# Patient Record
Sex: Female | Born: 1995 | Race: Black or African American | Hispanic: No | Marital: Single | Smoking: Former smoker
Health system: Southern US, Community
[De-identification: ages and names within clinical notes are randomized; demographics above are authoritative.]

## PROBLEM LIST (undated history)

## (undated) DIAGNOSIS — Z789 Other specified health status: Secondary | ICD-10-CM

## (undated) HISTORY — PX: NO PAST SURGERIES: SHX2092

## (undated) HISTORY — DX: Other specified health status: Z78.9

---
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
## 2016-07-19 ENCOUNTER — Ambulatory Visit (INDEPENDENT_AMBULATORY_CARE_PROVIDER_SITE_OTHER): Payer: BLUE CROSS/BLUE SHIELD | Admitting: *Deleted

## 2016-07-19 ENCOUNTER — Encounter: Payer: Self-pay | Admitting: Obstetrics and Gynecology

## 2016-07-19 DIAGNOSIS — Z3201 Encounter for pregnancy test, result positive: Secondary | ICD-10-CM

## 2016-07-19 DIAGNOSIS — Z3200 Encounter for pregnancy test, result unknown: Secondary | ICD-10-CM

## 2016-07-19 LAB — POCT PREGNANCY, URINE: Preg Test, Ur: POSITIVE — AB

## 2016-07-19 NOTE — Progress Notes (Signed)
UPT + today.  LMP = 06/07/16.  EDD - 03/14/17, 6w 0d

## 2016-08-31 LAB — OB RESULTS CONSOLE PLATELET COUNT
PLATELETS: 191 10*3/uL
PLATELETS: 191 10*3/uL

## 2016-08-31 LAB — OB RESULTS CONSOLE ABO/RH: RH Type: POSITIVE

## 2016-08-31 LAB — OB RESULTS CONSOLE VARICELLA ZOSTER ANTIBODY, IGG: VARICELLA IGG: IMMUNE

## 2016-08-31 LAB — SICKLE CELL SCREEN: Sickle Cell Screen: NORMAL

## 2016-08-31 LAB — CYSTIC FIBROSIS DIAGNOSTIC STUDY: INTERPRETATION-CFDNA: NEGATIVE

## 2016-08-31 LAB — OB RESULTS CONSOLE HGB/HCT, BLOOD
HCT: 39 %
HEMATOCRIT: 39 %
HEMOGLOBIN: 12.4 g/dL
Hemoglobin: 12.4 g/dL

## 2016-08-31 LAB — OB RESULTS CONSOLE ANTIBODY SCREEN: ANTIBODY SCREEN: NEGATIVE

## 2016-08-31 LAB — OB RESULTS CONSOLE HEPATITIS B SURFACE ANTIGEN: HEP B S AG: NEGATIVE

## 2016-08-31 LAB — OB RESULTS CONSOLE RUBELLA ANTIBODY, IGM: Rubella: IMMUNE

## 2016-08-31 LAB — OB RESULTS CONSOLE GC/CHLAMYDIA
Chlamydia: NEGATIVE
Gonorrhea: NEGATIVE

## 2016-08-31 LAB — OB RESULTS CONSOLE RPR: RPR: NONREACTIVE

## 2016-09-01 ENCOUNTER — Other Ambulatory Visit (HOSPITAL_COMMUNITY): Payer: Self-pay | Admitting: Nurse Practitioner

## 2016-09-01 DIAGNOSIS — Z3A13 13 weeks gestation of pregnancy: Secondary | ICD-10-CM

## 2016-09-01 DIAGNOSIS — Z3682 Encounter for antenatal screening for nuchal translucency: Secondary | ICD-10-CM

## 2016-09-07 ENCOUNTER — Ambulatory Visit (HOSPITAL_COMMUNITY)
Admission: RE | Admit: 2016-09-07 | Discharge: 2016-09-07 | Disposition: A | Discharge status: Home / Self Care | Payer: BLUE CROSS/BLUE SHIELD | Source: Ambulatory Visit | Attending: Nurse Practitioner | Admitting: Nurse Practitioner

## 2016-09-07 ENCOUNTER — Other Ambulatory Visit: Payer: Self-pay

## 2016-09-07 ENCOUNTER — Encounter (HOSPITAL_COMMUNITY): Payer: Self-pay

## 2016-09-07 DIAGNOSIS — Z3A13 13 weeks gestation of pregnancy: Secondary | ICD-10-CM | POA: Insufficient documentation

## 2016-09-07 DIAGNOSIS — Z3682 Encounter for antenatal screening for nuchal translucency: Secondary | ICD-10-CM | POA: Diagnosis not present

## 2016-09-07 DIAGNOSIS — O289 Unspecified abnormal findings on antenatal screening of mother: Secondary | ICD-10-CM | POA: Insufficient documentation

## 2016-11-08 ENCOUNTER — Ambulatory Visit (INDEPENDENT_AMBULATORY_CARE_PROVIDER_SITE_OTHER): Payer: BLUE CROSS/BLUE SHIELD | Admitting: Obstetrics & Gynecology

## 2016-11-08 ENCOUNTER — Encounter: Payer: Self-pay | Admitting: Obstetrics & Gynecology

## 2016-11-08 DIAGNOSIS — O4422 Partial placenta previa NOS or without hemorrhage, second trimester: Secondary | ICD-10-CM

## 2016-11-08 DIAGNOSIS — Z3490 Encounter for supervision of normal pregnancy, unspecified, unspecified trimester: Secondary | ICD-10-CM | POA: Insufficient documentation

## 2016-11-08 DIAGNOSIS — Z3402 Encounter for supervision of normal first pregnancy, second trimester: Secondary | ICD-10-CM

## 2016-11-08 DIAGNOSIS — Z62819 Personal history of unspecified abuse in childhood: Secondary | ICD-10-CM | POA: Insufficient documentation

## 2016-11-08 DIAGNOSIS — O3432 Maternal care for cervical incompetence, second trimester: Secondary | ICD-10-CM

## 2016-11-08 DIAGNOSIS — O4420 Partial placenta previa NOS or without hemorrhage, unspecified trimester: Secondary | ICD-10-CM

## 2016-11-08 NOTE — Progress Notes (Signed)
    Obstetrics Encounter Consultation Note  Gabriella Singh is a  G2P0010 at [redacted]w[redacted]d who was referred from [facility] for high-risk pregnancy; no records have been obtained. After calling the [facility], was able to ascertain that patient had an ultrasound that showed possible slight funneling and a dynamic cervix, but cervical length was 5.3 cm. Also had a partial placental previa. These results were read to me over the phone by Linus Orn, the [facility] charge RN at [facility].   Patient was last seen there 2 weeks ago, no current complaints.  As of now, patient needs a follow up scan at [facility] which was scheduled on 11/16/2016, will follow up results and manage accordingly.  Reassured about the 5.3 cm cervical length.  She was told the marginal previa will likely resolve over time, pelvic rest restrictions emphasized for now.  Preterm labor precautions also reviewed.  Will obtain records and have a full OB visit in about 2 weeks (11/23/2016).  Patient does not want to return to [facility], she will continue her prenatal care here.   The nature of [facility] with multiple MDs and other Advanced Practice Providers was explained to patient; also emphasized that residents, students are part of our team.  She desires a private physician with a smaller group, was told to contact [facility] if she desires. She will decide if she wants that, or continue care with [place] as scheduled on 11/23/2016.   Total face-to-face time with patient: 15 minutes. Over 50% of encounter was spent on counseling and coordination of care.   Jaynie Collins, MD, FACOG Attending Obstetrician & Gynecologist, [facility] for [employer], [facility]

## 2016-11-08 NOTE — Patient Instructions (Signed)
Return to clinic for any scheduled appointments or obstetric concerns, or go to [facility] for evaluation  

## 2016-11-09 ENCOUNTER — Encounter: Payer: Self-pay | Admitting: *Deleted

## 2016-11-14 ENCOUNTER — Encounter: Payer: Self-pay | Admitting: *Deleted

## 2016-11-14 ENCOUNTER — Other Ambulatory Visit: Payer: Self-pay | Admitting: *Deleted

## 2016-11-16 ENCOUNTER — Ambulatory Visit (HOSPITAL_COMMUNITY)
Admission: RE | Admit: 2016-11-16 | Discharge: 2016-11-16 | Disposition: A | Discharge status: Home / Self Care | Payer: BLUE CROSS/BLUE SHIELD | Source: Ambulatory Visit | Attending: Obstetrics & Gynecology | Admitting: Obstetrics & Gynecology

## 2016-11-16 DIAGNOSIS — Z3402 Encounter for supervision of normal first pregnancy, second trimester: Secondary | ICD-10-CM

## 2016-11-16 DIAGNOSIS — O321XX0 Maternal care for breech presentation, not applicable or unspecified: Secondary | ICD-10-CM | POA: Insufficient documentation

## 2016-11-16 DIAGNOSIS — Z3A23 23 weeks gestation of pregnancy: Secondary | ICD-10-CM | POA: Diagnosis not present

## 2016-11-23 ENCOUNTER — Ambulatory Visit (INDEPENDENT_AMBULATORY_CARE_PROVIDER_SITE_OTHER): Payer: BLUE CROSS/BLUE SHIELD | Admitting: Obstetrics & Gynecology

## 2016-11-23 ENCOUNTER — Encounter: Payer: Self-pay | Admitting: Obstetrics & Gynecology

## 2016-11-23 VITALS — BP 109/58 | HR 94 | Wt 216.2 lb

## 2016-11-23 DIAGNOSIS — Z3482 Encounter for supervision of other normal pregnancy, second trimester: Secondary | ICD-10-CM

## 2016-11-23 LAB — POCT URINALYSIS DIP (DEVICE)
Bilirubin Urine: NEGATIVE
Glucose, UA: NEGATIVE mg/dL
HGB URINE DIPSTICK: NEGATIVE
Ketones, ur: NEGATIVE mg/dL
NITRITE: NEGATIVE
PH: 6 (ref 5.0–8.0)
Protein, ur: NEGATIVE mg/dL
UROBILINOGEN UA: 0.2 mg/dL (ref 0.0–1.0)
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
## 2016-11-23 NOTE — Patient Instructions (Addendum)
Thank you for enrolling in MyChart. Please follow the instructions below to securely access your online medical record. MyChart allows you to send messages to your doctor, view your test results, manage appointments, and more.   How Do I Sign Up? 1. In your Internet browser, go to [employer] and enter [email or web address]. 2. Click on the Sign Up Now link in the Sign In box. You will see the New Member Sign Up page. 3. Enter your MyChart Access Code exactly as it appears below. You will not need to use this code after you've completed the sign-up process. If you do not sign up before the expiration date, you must request a new code.  MyChart Access Code: [email or web address] Expires: 01/22/2017  2:10 PM  4. Enter your Social Security Number ([email or web address]) and Date of Birth (mm/dd/yyyy) as indicated and click Submit. You will be taken to the next sign-up page. 5. Create a MyChart ID. This will be your MyChart login ID and cannot be changed, so think of one that is secure and easy to remember. 6. Create a MyChart password. You can change your password at any time. 7. Enter your Password Reset Question and Answer. This can be used at a later time if you forget your password.  8. Enter your e-mail address. You will receive e-mail notification when new information is available in MyChart. 9. Click Sign Up. You can now view your medical record.   Additional Information Remember, MyChart is NOT to be used for urgent needs. For medical emergencies, dial 911.     Tdap Vaccine (Tetanus, Diphtheria and Pertussis): What You Need to Know 1. Why get vaccinated? Tetanus, diphtheria and pertussis are very serious diseases. Tdap vaccine can protect [place] from these diseases. And, Tdap vaccine given to pregnant women can protect newborn babies against pertussis. TETANUS (Lockjaw) is rare in the [place] today. It causes painful muscle tightening and stiffness, usually all over the body.  It can  lead to tightening of muscles in the head and neck so you can't open your mouth, swallow, or sometimes even breathe. Tetanus kills about 1 out of 10 people who are infected even after receiving the best medical care. DIPHTHERIA is also rare in the [place] today. It can cause a thick coating to form in the back of the throat.  It can lead to breathing problems, heart failure, paralysis, and death. PERTUSSIS (Whooping Cough) causes severe coughing spells, which can cause difficulty breathing, vomiting and disturbed sleep.  It can also lead to weight loss, incontinence, and rib fractures. Up to 2 in 100 adolescents and 5 in 100 adults with pertussis are hospitalized or have complications, which could include pneumonia or death. These diseases are caused by bacteria. Diphtheria and pertussis are spread from person to person through secretions from coughing or sneezing. Tetanus enters the body through cuts, scratches, or wounds. Before vaccines, as many as 200,000 cases of diphtheria, 200,000 cases of pertussis, and hundreds of cases of tetanus, were reported in the [place] each year. Since vaccination began, reports of cases for tetanus and diphtheria have dropped by about 99% and for pertussis by about 80%. 2. Tdap vaccine Tdap vaccine can protect adolescents and adults from tetanus, diphtheria, and pertussis. One dose of Tdap is routinely given at age 89 or 74. People who did not get Tdap at that age should get it as soon as possible. Tdap is especially important for healthcare professionals and anyone having close contact with a baby younger  than 12 months. Pregnant women should get a dose of Tdap during every pregnancy, to protect the newborn from pertussis. Infants are most at risk for severe, life-threatening complications from pertussis. Another vaccine, called Td, protects against tetanus and diphtheria, but not pertussis. A Td booster should be given every 10 years. Tdap may be given as  one of these boosters if you have never gotten Tdap before. Tdap may also be given after a severe cut or burn to prevent tetanus infection. Your doctor or the person giving you the vaccine can give you more information. Tdap may safely be given at the same time as other vaccines. 3. Some people should not get this vaccine  A person who has ever had a life-threatening allergic reaction after a previous dose of any diphtheria, tetanus or pertussis containing vaccine, OR has a severe allergy to any part of this vaccine, should not get Tdap vaccine. Tell the person giving the vaccine about any severe allergies.  Anyone who had coma or long repeated seizures within 7 days after a childhood dose of DTP or DTaP, or a previous dose of Tdap, should not get Tdap, unless a cause other than the vaccine was found. They can still get Td.  Talk to your doctor if you:  have seizures or another nervous system problem,  had severe pain or swelling after any vaccine containing diphtheria, tetanus or pertussis,  ever had a condition called Guillain-Barr Syndrome (GBS),  aren't feeling well on the day the shot is scheduled. 4. Risks With any medicine, including vaccines, there is a chance of side effects. These are usually mild and go away on their own. Serious reactions are also possible but are rare. Most people who get Tdap vaccine do not have any problems with it. Mild problems following Tdap:  (Did not interfere with activities)  Pain where the shot was given (about 3 in 4 adolescents or 2 in 3 adults)  Redness or swelling where the shot was given (about 1 person in 5)  Mild fever of at least 100.24F (up to about 1 in 25 adolescents or 1 in 100 adults)  Headache (about 3 or 4 people in 10)  Tiredness (about 1 person in 3 or 4)  Nausea, vomiting, diarrhea, stomach ache (up to 1 in 4 adolescents or 1 in 10 adults)  Chills, sore joints (about 1 person in 10)  Body aches (about 1 person in 3 or  4)  Rash, swollen glands (uncommon) Moderate problems following Tdap:  (Interfered with activities, but did not require medical attention)  Pain where the shot was given (up to 1 in 5 or 6)  Redness or swelling where the shot was given (up to about 1 in 16 adolescents or 1 in 12 adults)  Fever over 102F (about 1 in 100 adolescents or 1 in 250 adults)  Headache (about 1 in 7 adolescents or 1 in 10 adults)  Nausea, vomiting, diarrhea, stomach ache (up to 1 or 3 people in 100)  Swelling of the entire arm where the shot was given (up to about 1 in 500). Severe problems following Tdap:  (Unable to perform usual activities; required medical attention)  Swelling, severe pain, bleeding and redness in the arm where the shot was given (rare). Problems that could happen after any vaccine:   People sometimes faint after a medical procedure, including vaccination. Sitting or lying down for about 15 minutes can help prevent fainting, and injuries caused by a fall. Tell your doctor if  you feel dizzy, or have vision changes or ringing in the ears.  Some people get severe pain in the shoulder and have difficulty moving the arm where a shot was given. This happens very rarely.  Any medication can cause a severe allergic reaction. Such reactions from a vaccine are very rare, estimated at fewer than 1 in a million doses, and would happen within a few minutes to a few hours after the vaccination. As with any medicine, there is a very remote chance of a vaccine causing a serious injury or death. The safety of vaccines is always being monitored. For more information, visit: http://floyd.org/www.cdc.gov/vaccinesafety/ 5. What if there is a serious problem? What should I look for?  Look for anything that concerns you, such as signs of a severe allergic reaction, very high fever, or unusual behavior. Signs of a severe allergic reaction can include hives, swelling of the face and throat, difficulty breathing, a fast  heartbeat, dizziness, and weakness. These would usually start a few minutes to a few hours after the vaccination. What should I do?   If you think it is a severe allergic reaction or other emergency that can't wait, call 9-1-1 or get the person to the nearest hospital. Otherwise, call your doctor.  Afterward, the reaction should be reported to the Vaccine Adverse Event Reporting System (VAERS). Your doctor might file this report, or you can do it yourself through the VAERS web site at www.vaers.LAgents.nohhs.gov, or by calling 1-609-535-3100.  VAERS does not give medical advice. 6. The National Vaccine Injury Compensation Program The [employer] Vaccine Injury Compensation Program (VICP) is a federal program that was created to compensate people who may have been injured by certain vaccines. Persons who believe they may have been injured by a vaccine can learn about the program and about filing a claim by calling 1-(858)412-5375 or visiting the VICP website at SpiritualWord.atwww.hrsa.gov/vaccinecompensation. There is a time limit to file a claim for compensation. 7. How can I learn more?  Ask your doctor. He or she can give you the vaccine package insert or suggest other sources of information.  Call your local or state health department.  Contact the Centers for Disease Control and Prevention (CDC):  Call 586-225-95601-(240) 294-2655 (1-800-CDC-INFO) or  Visit CDC's website at PicCapture.uywww.cdc.gov/vaccines CDC Tdap Vaccine VIS (09/09/13) This information is not intended to replace advice given to you by your health care provider. Make sure you discuss any questions you have with your health care provider. Document Released: 01/02/2012 Document Revised: 03/23/2016 Document Reviewed: 03/23/2016    Third Trimester of Pregnancy The third trimester is from week 28 through week 40 (months 7 through 9). The third trimester is a time when the unborn baby (fetus) is growing rapidly. At the end of the ninth month, the fetus is about 20 inches in  length and weighs 6-10 pounds. Body changes during your third trimester Your body will continue to go through many changes during pregnancy. The changes vary from woman to woman. During the third trimester:  Your weight will continue to increase. You can expect to gain 25-35 pounds (11-16 kg) by the end of the pregnancy.  You may begin to get stretch marks on your hips, abdomen, and breasts.  You may urinate more often because the fetus is moving lower into your pelvis and pressing on your bladder.  You may develop or continue to have heartburn. This is caused by increased hormones that slow down muscles in the digestive tract.  You may develop or continue  to have constipation because increased hormones slow digestion and cause the muscles that push waste through your intestines to relax.  You may develop hemorrhoids. These are swollen veins (varicose veins) in the rectum that can itch or be painful.  You may develop swollen, bulging veins (varicose veins) in your legs.  You may have increased body aches in the pelvis, back, or thighs. This is due to weight gain and increased hormones that are relaxing your joints.  You may have changes in your hair. These can include thickening of your hair, rapid growth, and changes in texture. Some women also have hair loss during or after pregnancy, or hair that feels dry or thin. Your hair will most likely return to normal after your baby is born.  Your breasts will continue to grow and they will continue to become tender. A yellow fluid (colostrum) may leak from your breasts. This is the first milk you are producing for your baby.  Your belly button may stick out.  You may notice more swelling in your hands, face, or ankles.  You may have increased tingling or numbness in your hands, arms, and legs. The skin on your belly may also feel numb.  You may feel short of breath because of your expanding uterus.  You may have more problems sleeping.  This can be caused by the size of your belly, increased need to urinate, and an increase in your body's metabolism.  You may notice the fetus "dropping," or moving lower in your abdomen (lightening).  You may have increased vaginal discharge.  You may notice your joints feel loose and you may have pain around your pelvic bone. What to expect at prenatal visits You will have prenatal exams every 2 weeks until week 36. Then you will have weekly prenatal exams. During a routine prenatal visit:  You will be weighed to make sure you and the baby are growing normally.  Your blood pressure will be taken.  Your abdomen will be measured to track your baby's growth.  The fetal heartbeat will be listened to.  Any test results from the previous visit will be discussed.  You may have a cervical check near your due date to see if your cervix has softened or thinned (effaced).  You will be tested for Group B streptococcus. This happens between 35 and 37 weeks. Your health care provider may ask you:  What your birth plan is.  How you are feeling.  If you are feeling the baby move.  If you have had any abnormal symptoms, such as leaking fluid, bleeding, severe headaches, or abdominal cramping.  If you are using any tobacco products, including cigarettes, chewing tobacco, and electronic cigarettes.  If you have any questions. Other tests or screenings that may be performed during your third trimester include:  Blood tests that check for low iron levels (anemia).  Fetal testing to check the health, activity level, and growth of the fetus. Testing is done if you have certain medical conditions or if there are problems during the pregnancy.  Nonstress test (NST). This test checks the health of your baby to make sure there are no signs of problems, such as the baby not getting enough oxygen. During this test, a belt is placed around your belly. The baby is made to move, and its heart rate is  monitored during movement. What is false labor? False labor is a condition in which you feel small, irregular tightenings of the muscles in the womb (contractions) that  usually go away with rest, changing position, or drinking water. These are called Braxton Hicks contractions. Contractions may last for hours, days, or even weeks before true labor sets in. If contractions come at regular intervals, become more frequent, increase in intensity, or become painful, you should see your health care provider. What are the signs of labor?  Abdominal cramps.  Regular contractions that start at 10 minutes apart and become stronger and more frequent with time.  Contractions that start on the top of the uterus and spread down to the lower abdomen and back.  Increased pelvic pressure and dull back pain.  A watery or bloody mucus discharge that comes from the vagina.  Leaking of amniotic fluid. This is also known as your "water breaking." It could be a slow trickle or a gush. Let your health care provider know if it has a color or strange odor. If you have any of these signs, call your health care provider right away, even if it is before your due date. Follow these instructions at home: Medicines   Follow your health care provider's instructions regarding medicine use. Specific medicines may be either safe or unsafe to take during pregnancy.  Take a prenatal vitamin that contains at least 600 micrograms (mcg) of folic acid.  If you develop constipation, try taking a stool softener if your health care provider approves. Eating and drinking   Eat a balanced diet that includes fresh fruits and vegetables, whole grains, good sources of protein such as meat, eggs, or tofu, and low-fat dairy. Your health care provider will help you determine the amount of weight gain that is right for you.  Avoid raw meat and uncooked cheese. These carry germs that can cause birth defects in the baby.  If you have low  calcium intake from food, talk to your health care provider about whether you should take a daily calcium supplement.  Eat four or five small meals rather than three large meals a day.  Limit foods that are high in fat and processed sugars, such as fried and sweet foods.  To prevent constipation:  Drink enough fluid to keep your urine clear or pale yellow.  Eat foods that are high in fiber, such as fresh fruits and vegetables, whole grains, and beans. Activity   Exercise only as directed by your health care provider. Most women can continue their usual exercise routine during pregnancy. Try to exercise for 30 minutes at least 5 days a week. Stop exercising if you experience uterine contractions.  Avoid heavy lifting.  Do not exercise in extreme heat or humidity, or at high altitudes.  Wear low-heel, comfortable shoes.  Practice good posture.  You may continue to have sex unless your health care provider tells you otherwise. Relieving pain and discomfort   Take frequent breaks and rest with your legs elevated if you have leg cramps or low back pain.  Take warm sitz baths to soothe any pain or discomfort caused by hemorrhoids. Use hemorrhoid cream if your health care provider approves.  Wear a good support bra to prevent discomfort from breast tenderness.  If you develop varicose veins:  Wear support pantyhose or compression stockings as told by your healthcare provider.  Elevate your feet for 15 minutes, 3-4 times a day. Prenatal care   Write down your questions. Take them to your prenatal visits.  Keep all your prenatal visits as told by your health care provider. This is important. Safety   Wear your seat belt at  all times when driving.  Make a list of emergency phone numbers, including numbers for family, friends, the hospital, and police and fire departments. General instructions   Avoid cat litter boxes and soil used by cats. These carry germs that can cause birth  defects in the baby. If you have a cat, ask someone to clean the litter box for you.  Do not travel far distances unless it is absolutely necessary and only with the approval of your health care provider.  Do not use hot tubs, steam rooms, or saunas.  Do not drink alcohol.  Do not use any products that contain nicotine or tobacco, such as cigarettes and e-cigarettes. If you need help quitting, ask your health care provider.  Do not use any medicinal herbs or unprescribed drugs. These chemicals affect the formation and growth of the baby.  Do not douche or use tampons or scented sanitary pads.  Do not cross your legs for long periods of time.  To prepare for the arrival of your baby:  Take prenatal classes to understand, practice, and ask questions about labor and delivery.  Make a trial run to the hospital.  Visit the hospital and tour the maternity area.  Arrange for maternity or paternity leave through employers.  Arrange for family and friends to take care of pets while you are in the hospital.  Purchase a rear-facing car seat and make sure you know how to install it in your car.  Pack your hospital bag.  Prepare the baby's nursery. Make sure to remove all pillows and stuffed animals from the baby's crib to prevent suffocation.  Visit your dentist if you have not gone during your pregnancy. Use a soft toothbrush to brush your teeth and be gentle when you floss. Contact a health care provider if:  You are unsure if you are in labor or if your water has broken.  You become dizzy.  You have mild pelvic cramps, pelvic pressure, or nagging pain in your abdominal area.  You have lower back pain.  You have persistent nausea, vomiting, or diarrhea.  You have an unusual or bad smelling vaginal discharge.  You have pain when you urinate. Get help right away if:  Your water breaks before 37 weeks.  You have regular contractions less than 5 minutes apart before 37  weeks.  You have a fever.  You are leaking fluid from your vagina.  You have spotting or bleeding from your vagina.  You have severe abdominal pain or cramping.  You have rapid weight loss or weight gain.  You have shortness of breath with chest pain.  You notice sudden or extreme swelling of your face, hands, ankles, feet, or legs.  Your baby makes fewer than 10 movements in 2 hours.  You have severe headaches that do not go away when you take medicine.  You have vision changes. Summary  The third trimester is from week 28 through week 40, months 7 through 9. The third trimester is a time when the unborn baby (fetus) is growing rapidly.  During the third trimester, your discomfort may increase as you and your baby continue to gain weight. You may have abdominal, leg, and back pain, sleeping problems, and an increased need to urinate.  During the third trimester your breasts will keep growing and they will continue to become tender. A yellow fluid (colostrum) may leak from your breasts. This is the first milk you are producing for your baby.  False labor is a condition  in which you feel small, irregular tightenings of the muscles in the womb (contractions) that eventually go away. These are called Braxton Hicks contractions. Contractions may last for hours, days, or even weeks before true labor sets in.  Signs of labor can include: abdominal cramps; regular contractions that start at 10 minutes apart and become stronger and more frequent with time; watery or bloody mucus discharge that comes from the vagina; increased pelvic pressure and dull back pain; and leaking of amniotic fluid. This information is not intended to replace advice given to you by your health care provider. Make sure you discuss any questions you have with your health care provider. Document Released: 06/27/2001 Document Revised: 12/09/2015 Document Reviewed: 09/03/2012 Elsevier Interactive Patient Education   2017 Elsevier [employer].    AREA PEDIATRIC/FAMILY PRACTICE PHYSICIANS  [facility] [address] [place], [place]  [address] Phone - [phone]   Fax - [phone]  [facility] [address] [place], [place] [address] Phone - [phone]   Fax - [phone]  [facility] [address] [place], [place]  [address] Phone - [phone]   Fax - [phone]  [facility] [address] [place], [place]  [address] Phone - [phone]   Fax - [phone]  [facility] [address] [place], [place]  [address] Phone - [phone]   Fax - [phone]  [facility] [address] [place], [place]  [address] Phone - [phone]   Fax - [phone]  [facility] [place] [address] [place], [place]  [address] Phone - [phone]   Fax - [phone]  [facility] AT [facility] [address] [place], [address] [place], [place]  [address] Phone - [phone]   Fax - [phone]  [facility] AT [facility] [address] [place], [place]  [address] Phone - [phone]   Fax - [phone] [facility] AT [place] [address] [place], [place]  [address] Phone - [phone]   Fax - [phone]  [facility] [address] [place], [place]  [address] Phone - [phone]   Fax - [phone]  [facility] [address], [address] [place], [place]  [address] Phone - [phone]   Fax - [phone]  [facility] [address] [place], [place]  [address] Phone - [phone]   Fax - [phone]  [facility] [address], [address] [place], [place]  [address] Phone - [phone]  [facility] [address] [place], [place]  [address] Phone - [phone]   Fax - [phone]  [facility] [address] [place], [place]  [address] Phone - [phone]   Fax - [phone]  [facility] [address] [place], [place]  [address] Phone - [phone]   Fax - [phone]  [facility] [address] [place], [place]  [address] Phone - [phone]   Fax - [phone]   [facility] [address] [place], [place]  [address] Phone - [phone]   Fax - [phone]  [facility] [address] [address] [place], [place]  [address] Phone - [phone]   Fax - [phone]  DAVID RUBIN [address], [address] [place], [place]  [address] Phone - [phone]   Fax - [phone]  [facility] [address] [place], [place]  [address] Phone - [phone]   Fax - [phone]  [facility] [address] [place], [place]  [address] Phone - [phone]   Fax - [phone] Gerarda Fraction [address] [place], [place]  [address] Phone - [phone]   Fax - [phone]  [facility] [address] [place], [place]  [address] Phone - [phone]   Fax - [phone]  [facility] [place] [address] [place], [place]  [address] Phone - [phone]   Fax - [phone]  [facility] [place] Wyvonne Lenz MD [address] [place] [address] Phone [phone]  Fax [phone]

## 2016-11-23 NOTE — Progress Notes (Signed)
Initial prenatal packet given Breastfeeding education done

## 2016-12-21 ENCOUNTER — Encounter: Payer: BLUE CROSS/BLUE SHIELD | Admitting: Obstetrics & Gynecology

## 2017-01-05 ENCOUNTER — Ambulatory Visit (INDEPENDENT_AMBULATORY_CARE_PROVIDER_SITE_OTHER): Payer: BLUE CROSS/BLUE SHIELD | Admitting: Obstetrics and Gynecology

## 2017-01-05 VITALS — BP 112/64 | HR 82 | Wt 217.9 lb

## 2017-01-05 DIAGNOSIS — Z23 Encounter for immunization: Secondary | ICD-10-CM | POA: Diagnosis not present

## 2017-01-05 DIAGNOSIS — Z3493 Encounter for supervision of normal pregnancy, unspecified, third trimester: Secondary | ICD-10-CM

## 2017-01-05 DIAGNOSIS — Z3483 Encounter for supervision of other normal pregnancy, third trimester: Secondary | ICD-10-CM
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
## 2017-01-05 NOTE — Patient Instructions (Addendum)
Third Trimester of Pregnancy The third trimester is from week 28 through week 40 (months 7 through 9). The third trimester is a time when the unborn baby (fetus) is growing rapidly. At the end of the ninth month, the fetus is about 20 inches in length and weighs 6-10 pounds. Body changes during your third trimester Your body will continue to go through many changes during pregnancy. The changes vary from woman to woman. During the third trimester:  Your weight will continue to increase. You can expect to gain 25-35 pounds (11-16 kg) by the end of the pregnancy.  You may begin to get stretch marks on your hips, abdomen, and breasts.  You may urinate more often because the fetus is moving lower into your pelvis and pressing on your bladder.  You may develop or continue to have heartburn. This is caused by increased hormones that slow down muscles in the digestive tract.  You may develop or continue to have constipation because increased hormones slow digestion and cause the muscles that push waste through your intestines to relax.  You may develop hemorrhoids. These are swollen veins (varicose veins) in the rectum that can itch or be painful.  You may develop swollen, bulging veins (varicose veins) in your legs.  You may have increased body aches in the pelvis, back, or thighs. This is due to weight gain and increased hormones that are relaxing your joints.  You may have changes in your hair. These can include thickening of your hair, rapid growth, and changes in texture. Some women also have hair loss during or after pregnancy, or hair that feels dry or thin. Your hair will most likely return to normal after your baby is born.  Your breasts will continue to grow and they will continue to become tender. A yellow fluid (colostrum) may leak from your breasts. This is the first milk you are producing for your baby.  Your belly button may stick out.  You may notice more swelling in your hands,  face, or ankles.  You may have increased tingling or numbness in your hands, arms, and legs. The skin on your belly may also feel numb.  You may feel short of breath because of your expanding uterus.  You may have more problems sleeping. This can be caused by the size of your belly, increased need to urinate, and an increase in your body's metabolism.  You may notice the fetus "dropping," or moving lower in your abdomen (lightening).  You may have increased vaginal discharge.  You may notice your joints feel loose and you may have pain around your pelvic bone.  What to expect at prenatal visits You will have prenatal exams every 2 weeks until week 36. Then you will have weekly prenatal exams. During a routine prenatal visit:  You will be weighed to make sure you and the baby are growing normally.  Your blood pressure will be taken.  Your abdomen will be measured to track your baby's growth.  The fetal heartbeat will be listened to.  Any test results from the previous visit will be discussed.  You may have a cervical check near your due date to see if your cervix has softened or thinned (effaced).  You will be tested for Group B streptococcus. This happens between 35 and 37 weeks.  Your health care provider may ask you:  What your birth plan is.  How you are feeling.  If you are feeling the baby move.  If you have had   any abnormal symptoms, such as leaking fluid, bleeding, severe headaches, or abdominal cramping.  If you are using any tobacco products, including cigarettes, chewing tobacco, and electronic cigarettes.  If you have any questions.  Other tests or screenings that may be performed during your third trimester include:  Blood tests that check for low iron levels (anemia).  Fetal testing to check the health, activity level, and growth of the fetus. Testing is done if you have certain medical conditions or if there are problems during the  pregnancy.  Nonstress test (NST). This test checks the health of your baby to make sure there are no signs of problems, such as the baby not getting enough oxygen. During this test, a belt is placed around your belly. The baby is made to move, and its heart rate is monitored during movement.  What is false labor? False labor is a condition in which you feel small, irregular tightenings of the muscles in the womb (contractions) that usually go away with rest, changing position, or drinking water. These are called Braxton Hicks contractions. Contractions may last for hours, days, or even weeks before true labor sets in. If contractions come at regular intervals, become more frequent, increase in intensity, or become painful, you should see your health care provider. What are the signs of labor?  Abdominal cramps.  Regular contractions that start at 10 minutes apart and become stronger and more frequent with time.  Contractions that start on the top of the uterus and spread down to the lower abdomen and back.  Increased pelvic pressure and dull back pain.  A watery or bloody mucus discharge that comes from the vagina.  Leaking of amniotic fluid. This is also known as your "water breaking." It could be a slow trickle or a gush. Let your health care provider know if it has a color or strange odor. If you have any of these signs, call your health care provider right away, even if it is before your due date. Follow these instructions at home: Medicines  Follow your health care provider's instructions regarding medicine use. Specific medicines may be either safe or unsafe to take during pregnancy.  Take a prenatal vitamin that contains at least 600 micrograms (mcg) of folic acid.  If you develop constipation, try taking a stool softener if your health care provider approves. Eating and drinking  Eat a balanced diet that includes fresh fruits and vegetables, whole grains, good sources of protein  such as meat, eggs, or tofu, and low-fat dairy. Your health care provider will help you determine the amount of weight gain that is right for you.  Avoid raw meat and uncooked cheese. These carry germs that can cause birth defects in the baby.  If you have low calcium intake from food, talk to your health care provider about whether you should take a daily calcium supplement.  Eat four or five small meals rather than three large meals a day.  Limit foods that are high in fat and processed sugars, such as fried and sweet foods.  To prevent constipation: ? Drink enough fluid to keep your urine clear or pale yellow. ? Eat foods that are high in fiber, such as fresh fruits and vegetables, whole grains, and beans. Activity  Exercise only as directed by your health care provider. Most women can continue their usual exercise routine during pregnancy. Try to exercise for 30 minutes at least 5 days a week. Stop exercising if you experience uterine contractions.  Avoid heavy   lifting.  Do not exercise in extreme heat or humidity, or at high altitudes.  Wear low-heel, comfortable shoes.  Practice good posture.  You may continue to have sex unless your health care provider tells you otherwise. Relieving pain and discomfort  Take frequent breaks and rest with your legs elevated if you have leg cramps or low back pain.  Take warm sitz baths to soothe any pain or discomfort caused by hemorrhoids. Use hemorrhoid cream if your health care provider approves.  Wear a good support bra to prevent discomfort from breast tenderness.  If you develop varicose veins: ? Wear support pantyhose or compression stockings as told by your healthcare provider. ? Elevate your feet for 15 minutes, 3-4 times a day. Prenatal care  Write down your questions. Take them to your prenatal visits.  Keep all your prenatal visits as told by your health care provider. This is important. Safety  Wear your seat belt at  all times when driving.  Make a list of emergency phone numbers, including numbers for family, friends, the hospital, and police and fire departments. General instructions  Avoid cat litter boxes and soil used by cats. These carry germs that can cause birth defects in the baby. If you have a cat, ask someone to clean the litter box for you.  Do not travel far distances unless it is absolutely necessary and only with the approval of your health care provider.  Do not use hot tubs, steam rooms, or saunas.  Do not drink alcohol.  Do not use any products that contain nicotine or tobacco, such as cigarettes and e-cigarettes. If you need help quitting, ask your health care provider.  Do not use any medicinal herbs or unprescribed drugs. These chemicals affect the formation and growth of the baby.  Do not douche or use tampons or scented sanitary pads.  Do not cross your legs for long periods of time.  To prepare for the arrival of your baby: ? Take prenatal classes to understand, practice, and ask questions about labor and delivery. ? Make a trial run to the hospital. ? Visit the hospital and tour the maternity area. ? Arrange for maternity or paternity leave through employers. ? Arrange for family and friends to take care of pets while you are in the hospital. ? Purchase a rear-facing car seat and make sure you know how to install it in your car. ? Pack your hospital bag. ? Prepare the baby's nursery. Make sure to remove all pillows and stuffed animals from the baby's crib to prevent suffocation.  Visit your dentist if you have not gone during your pregnancy. Use a soft toothbrush to brush your teeth and be gentle when you floss. Contact a health care provider if:  You are unsure if you are in labor or if your water has broken.  You become dizzy.  You have mild pelvic cramps, pelvic pressure, or nagging pain in your abdominal area.  You have lower back pain.  You have persistent  nausea, vomiting, or diarrhea.  You have an unusual or bad smelling vaginal discharge.  You have pain when you urinate. Get help right away if:  Your water breaks before 37 weeks.  You have regular contractions less than 5 minutes apart before 37 weeks.  You have a fever.  You are leaking fluid from your vagina.  You have spotting or bleeding from your vagina.  You have severe abdominal pain or cramping.  You have rapid weight loss or weight gain.    You have shortness of breath with chest pain.  You notice sudden or extreme swelling of your face, hands, ankles, feet, or legs.  Your baby makes fewer than 10 movements in 2 hours.  You have severe headaches that do not go away when you take medicine.  You have vision changes. Summary  The third trimester is from week 28 through week 40, months 7 through 9. The third trimester is a time when the unborn baby (fetus) is growing rapidly.  During the third trimester, your discomfort may increase as you and your baby continue to gain weight. You may have abdominal, leg, and back pain, sleeping problems, and an increased need to urinate.  During the third trimester your breasts will keep growing and they will continue to become tender. A yellow fluid (colostrum) may leak from your breasts. This is the first milk you are producing for your baby.  False labor is a condition in which you feel small, irregular tightenings of the muscles in the womb (contractions) that eventually go away. These are called Braxton Hicks contractions. Contractions may last for hours, days, or even weeks before true labor sets in.  Signs of labor can include: abdominal cramps; regular contractions that start at 10 minutes apart and become stronger and more frequent with time; watery or bloody mucus discharge that comes from the vagina; increased pelvic pressure and dull back pain; and leaking of amniotic fluid. This information is not intended to replace advice  given to you by your health care provider. Make sure you discuss any questions you have with your health care provider. Document Released: 06/27/2001 Document Revised: 12/09/2015 Document Reviewed: 09/03/2012 Elsevier Interactive Patient Education  2017 [employer].  Constipation, Adult Constipation is when a person:  Poops (has a bowel movement) fewer times in a week than normal.  Has a hard time pooping.  Has poop that is dry, hard, or bigger than normal.  Follow these instructions at home: Eating and drinking   Eat foods that have a lot of fiber, such as: ? Fresh fruits and vegetables. ? Whole grains. ? Beans.  Eat less of foods that are high in fat, low in fiber, or overly processed, such as: ? [place] fries. ? Hamburgers. ? Cookies. ? Candy. ? Soda.  Drink enough fluid to keep your pee (urine) clear or pale yellow. General instructions  Exercise regularly or as told by your doctor.  Go to the restroom when you feel like you need to poop. Do not hold it in.  Take over-the-counter and prescription medicines only as told by your doctor. These include any fiber supplements.  Do pelvic floor retraining exercises, such as: ? Doing deep breathing while relaxing your lower belly (abdomen). ? Relaxing your pelvic floor while pooping.  Watch your condition for any changes.  Keep all follow-up visits as told by your doctor. This is important. Contact a doctor if:  You have pain that gets worse.  You have a fever.  You have not pooped for 4 days.  You throw up (vomit).  You are not hungry.  You lose weight.  You are bleeding from the anus.  You have thin, pencil-like poop (stool). Get help right away if:  You have a fever, and your symptoms suddenly get worse.  You leak poop or have blood in your poop.  Your belly feels hard or bigger than normal (is bloated).  You have very bad belly pain.  You feel dizzy or you faint. This information is not  intended to replace advice given to you by your health care provider. Make sure you discuss any questions you have with your health care provider. Document Released: 12/20/2007 Document Revised: 01/21/2016 Document Reviewed: 12/22/2015 Elsevier Interactive Patient Education  2017 [employer]

## 2017-01-05 NOTE — Progress Notes (Signed)
PHQ9=17, Gabriella MuirJamie is not in today, no SI Breastfeeding tip reviewed 28wk labs, tdap today

## 2017-01-06 LAB — RPR: RPR Ser Ql: NONREACTIVE

## 2017-01-06 LAB — CBC
Hematocrit: 31 % — ABNORMAL LOW (ref 34.0–46.6)
Hemoglobin: 10.1 g/dL — ABNORMAL LOW (ref 11.1–15.9)
MCH: 28.1 pg (ref 26.6–33.0)
MCHC: 32.6 g/dL (ref 31.5–35.7)
MCV: 86 fL (ref 79–97)
PLATELETS: 186 10*3/uL (ref 150–379)
RBC: 3.6 x10E6/uL — ABNORMAL LOW (ref 3.77–5.28)
RDW: 12.3 % (ref 12.3–15.4)
WBC: 10.8 10*3/uL (ref 3.4–10.8)

## 2017-01-06 LAB — GLUCOSE TOLERANCE, 2 HOURS W/ 1HR
GLUCOSE, 1 HOUR: 100 mg/dL (ref 65–179)
GLUCOSE, 2 HOUR: 105 mg/dL (ref 65–152)
GLUCOSE, FASTING: 78 mg/dL (ref 65–91)

## 2017-01-06 LAB — HIV ANTIBODY (ROUTINE TESTING W REFLEX): HIV SCREEN 4TH GENERATION: NONREACTIVE

## 2017-01-15 ENCOUNTER — Ambulatory Visit (INDEPENDENT_AMBULATORY_CARE_PROVIDER_SITE_OTHER): Payer: Self-pay | Admitting: Clinical

## 2017-01-15 DIAGNOSIS — F4323 Adjustment disorder with mixed anxiety and depressed mood: Secondary | ICD-10-CM

## 2017-01-15 NOTE — BH Specialist Note (Signed)
Integrated Behavioral Health Initial Visit  MRN: [id number] Name: Gabriella Singh   Session Start time: 2:45 Session End time: 3:45 Total time: 1 hour  Type of Service: Integrated Behavioral Health- Individual/Family Interpretor:No. Interpretor Name and Language: n/a   Warm Hand Off Completed.       SUBJECTIVE: Gabriella Singh is a 21 y.o. female accompanied by patient. Patient was referred by Dr Alysia PennaErvin for depression, anxiety. Patient reports the following symptoms/concerns: Pt states her primary concern today is feeling depressed and anxious over uncertainties of soon being a single mother, while going to college. Duration of problem: Current pregnancy; Severity of problem: moderate  OBJECTIVE: Mood: Anxious and Depressed and Affect: Tearful Risk of harm to self or others: No plan to harm self or others   LIFE CONTEXT: Family and Social: Lives alone; moving mid-July with family in [place] School/Work: fulltime student at [employer] Self-Care: - Life Changes: Current pregnancy; upcoming move back to [place] with family, and upcoming change in schools  GOALS ADDRESSED: Patient will reduce symptoms of: anxiety, depression and stress and increase knowledge and/or ability of: self-management skills and also: Increase healthy adjustment to current life circumstances and Increase adequate support systems for patient/family   INTERVENTIONS: Solution-Focused Strategies, Psychoeducation and/or Health Education and Link to [facility] Resources  Standardized Assessments completed: GAD-7 and PHQ 9  ASSESSMENT: Patient currently experiencing Adjustment disorder with mixed anxious and depressed mood. Patient may benefit from psychoeducation and brief therapeutic intervention regarding coping with symptoms of anxiety and depression.  PLAN: 1. Follow up with behavioral health clinician on : Two weeks 2. Behavioral recommendations:  -CALM relaxation breathing exercise daily, as needed -Read educational  materials regarding coping with anxiety and depression -Use Postpartum planner as guide to help prepare for baby -Consider community resources discussed in office visit, as needed, if staying in [place] 3. Referral(s): Integrated [facility] ([facility]) and [facility] Resources:  [facility]   Valetta CloseJamie C VolinMcMannes, [place]    Depression screen [facility] 01/05/2017 11/23/2016 08/09/2015  Decreased Interest 3 0 0  Down, Depressed, Hopeless 3 0 0  PHQ - 2 Score 6 0 0  Altered sleeping 3 3 -  Tired, decreased energy 3 2 -  Change in appetite 3 0 -  Feeling bad or failure about yourself  1 0 -  Trouble concentrating 1 0 -  Moving slowly or fidgety/restless 0 0 -  Suicidal thoughts 0 0 -  PHQ-9 Score 17 5 -   GAD 7 : Generalized Anxiety Score 01/05/2017 11/23/2016  Nervous, Anxious, on Edge 3 0  Control/stop worrying 1 0  Worry too much - different things 2 0  Trouble relaxing 1 0  Restless 0 0  Easily annoyed or irritable 0 1  Afraid - awful might happen 2 0  Total GAD 7 Score 9 1

## 2017-01-26 ENCOUNTER — Ambulatory Visit (INDEPENDENT_AMBULATORY_CARE_PROVIDER_SITE_OTHER): Payer: BLUE CROSS/BLUE SHIELD | Admitting: Obstetrics and Gynecology

## 2017-01-26 VITALS — BP 102/55 | HR 102 | Wt 224.5 lb

## 2017-01-26 DIAGNOSIS — Z3483 Encounter for supervision of other normal pregnancy, third trimester: Secondary | ICD-10-CM

## 2017-01-26 MED ORDER — MISC. DEVICES MISC: 1.0000 [IU] | 0 refills | Status: AC | PRN
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
## 2017-01-26 NOTE — Patient Instructions (Signed)
Third Trimester of Pregnancy The third trimester is from week 28 through week 40 (months 7 through 9). The third trimester is a time when the unborn baby (fetus) is growing rapidly. At the end of the ninth month, the fetus is about 20 inches in length and weighs 6-10 pounds. Body changes during your third trimester Your body will continue to go through many changes during pregnancy. The changes vary from woman to woman. During the third trimester:  Your weight will continue to increase. You can expect to gain 25-35 pounds (11-16 kg) by the end of the pregnancy.  You may begin to get stretch marks on your hips, abdomen, and breasts.  You may urinate more often because the fetus is moving lower into your pelvis and pressing on your bladder.  You may develop or continue to have heartburn. This is caused by increased hormones that slow down muscles in the digestive tract.  You may develop or continue to have constipation because increased hormones slow digestion and cause the muscles that push waste through your intestines to relax.  You may develop hemorrhoids. These are swollen veins (varicose veins) in the rectum that can itch or be painful.  You may develop swollen, bulging veins (varicose veins) in your legs.  You may have increased body aches in the pelvis, back, or thighs. This is due to weight gain and increased hormones that are relaxing your joints.  You may have changes in your hair. These can include thickening of your hair, rapid growth, and changes in texture. Some women also have hair loss during or after pregnancy, or hair that feels dry or thin. Your hair will most likely return to normal after your baby is born.  Your breasts will continue to grow and they will continue to become tender. A yellow fluid (colostrum) may leak from your breasts. This is the first milk you are producing for your baby.  Your belly button may stick out.  You may notice more swelling in your hands,  face, or ankles.  You may have increased tingling or numbness in your hands, arms, and legs. The skin on your belly may also feel numb.  You may feel short of breath because of your expanding uterus.  You may have more problems sleeping. This can be caused by the size of your belly, increased need to urinate, and an increase in your body's metabolism.  You may notice the fetus "dropping," or moving lower in your abdomen (lightening).  You may have increased vaginal discharge.  You may notice your joints feel loose and you may have pain around your pelvic bone.  What to expect at prenatal visits You will have prenatal exams every 2 weeks until week 36. Then you will have weekly prenatal exams. During a routine prenatal visit:  You will be weighed to make sure you and the baby are growing normally.  Your blood pressure will be taken.  Your abdomen will be measured to track your baby's growth.  The fetal heartbeat will be listened to.  Any test results from the previous visit will be discussed.  You may have a cervical check near your due date to see if your cervix has softened or thinned (effaced).  You will be tested for Group B streptococcus. This happens between 35 and 37 weeks.  Your health care provider may ask you:  What your birth plan is.  How you are feeling.  If you are feeling the baby move.  If you have had   any abnormal symptoms, such as leaking fluid, bleeding, severe headaches, or abdominal cramping.  If you are using any tobacco products, including cigarettes, chewing tobacco, and electronic cigarettes.  If you have any questions.  Other tests or screenings that may be performed during your third trimester include:  Blood tests that check for low iron levels (anemia).  Fetal testing to check the health, activity level, and growth of the fetus. Testing is done if you have certain medical conditions or if there are problems during the  pregnancy.  Nonstress test (NST). This test checks the health of your baby to make sure there are no signs of problems, such as the baby not getting enough oxygen. During this test, a belt is placed around your belly. The baby is made to move, and its heart rate is monitored during movement.  What is false labor? False labor is a condition in which you feel small, irregular tightenings of the muscles in the womb (contractions) that usually go away with rest, changing position, or drinking water. These are called Braxton Hicks contractions. Contractions may last for hours, days, or even weeks before true labor sets in. If contractions come at regular intervals, become more frequent, increase in intensity, or become painful, you should see your health care provider. What are the signs of labor?  Abdominal cramps.  Regular contractions that start at 10 minutes apart and become stronger and more frequent with time.  Contractions that start on the top of the uterus and spread down to the lower abdomen and back.  Increased pelvic pressure and dull back pain.  A watery or bloody mucus discharge that comes from the vagina.  Leaking of amniotic fluid. This is also known as your "water breaking." It could be a slow trickle or a gush. Let your health care provider know if it has a color or strange odor. If you have any of these signs, call your health care provider right away, even if it is before your due date. Follow these instructions at home: Medicines  Follow your health care provider's instructions regarding medicine use. Specific medicines may be either safe or unsafe to take during pregnancy.  Take a prenatal vitamin that contains at least 600 micrograms (mcg) of folic acid.  If you develop constipation, try taking a stool softener if your health care provider approves. Eating and drinking  Eat a balanced diet that includes fresh fruits and vegetables, whole grains, good sources of protein  such as meat, eggs, or tofu, and low-fat dairy. Your health care provider will help you determine the amount of weight gain that is right for you.  Avoid raw meat and uncooked cheese. These carry germs that can cause birth defects in the baby.  If you have low calcium intake from food, talk to your health care provider about whether you should take a daily calcium supplement.  Eat four or five small meals rather than three large meals a day.  Limit foods that are high in fat and processed sugars, such as fried and sweet foods.  To prevent constipation: ? Drink enough fluid to keep your urine clear or pale yellow. ? Eat foods that are high in fiber, such as fresh fruits and vegetables, whole grains, and beans. Activity  Exercise only as directed by your health care provider. Most women can continue their usual exercise routine during pregnancy. Try to exercise for 30 minutes at least 5 days a week. Stop exercising if you experience uterine contractions.  Avoid heavy   lifting.  Do not exercise in extreme heat or humidity, or at high altitudes.  Wear low-heel, comfortable shoes.  Practice good posture.  You may continue to have sex unless your health care provider tells you otherwise. Relieving pain and discomfort  Take frequent breaks and rest with your legs elevated if you have leg cramps or low back pain.  Take warm sitz baths to soothe any pain or discomfort caused by hemorrhoids. Use hemorrhoid cream if your health care provider approves.  Wear a good support bra to prevent discomfort from breast tenderness.  If you develop varicose veins: ? Wear support pantyhose or compression stockings as told by your healthcare provider. ? Elevate your feet for 15 minutes, 3-4 times a day. Prenatal care  Write down your questions. Take them to your prenatal visits.  Keep all your prenatal visits as told by your health care provider. This is important. Safety  Wear your seat belt at  all times when driving.  Make a list of emergency phone numbers, including numbers for family, friends, the hospital, and police and fire departments. General instructions  Avoid cat litter boxes and soil used by cats. These carry germs that can cause birth defects in the baby. If you have a cat, ask someone to clean the litter box for you.  Do not travel far distances unless it is absolutely necessary and only with the approval of your health care provider.  Do not use hot tubs, steam rooms, or saunas.  Do not drink alcohol.  Do not use any products that contain nicotine or tobacco, such as cigarettes and e-cigarettes. If you need help quitting, ask your health care provider.  Do not use any medicinal herbs or unprescribed drugs. These chemicals affect the formation and growth of the baby.  Do not douche or use tampons or scented sanitary pads.  Do not cross your legs for long periods of time.  To prepare for the arrival of your baby: ? Take prenatal classes to understand, practice, and ask questions about labor and delivery. ? Make a trial run to the hospital. ? Visit the hospital and tour the maternity area. ? Arrange for maternity or paternity leave through employers. ? Arrange for family and friends to take care of pets while you are in the hospital. ? Purchase a rear-facing car seat and make sure you know how to install it in your car. ? Pack your hospital bag. ? Prepare the baby's nursery. Make sure to remove all pillows and stuffed animals from the baby's crib to prevent suffocation.  Visit your dentist if you have not gone during your pregnancy. Use a soft toothbrush to brush your teeth and be gentle when you floss. Contact a health care provider if:  You are unsure if you are in labor or if your water has broken.  You become dizzy.  You have mild pelvic cramps, pelvic pressure, or nagging pain in your abdominal area.  You have lower back pain.  You have persistent  nausea, vomiting, or diarrhea.  You have an unusual or bad smelling vaginal discharge.  You have pain when you urinate. Get help right away if:  Your water breaks before 37 weeks.  You have regular contractions less than 5 minutes apart before 37 weeks.  You have a fever.  You are leaking fluid from your vagina.  You have spotting or bleeding from your vagina.  You have severe abdominal pain or cramping.  You have rapid weight loss or weight gain.    You have shortness of breath with chest pain.  You notice sudden or extreme swelling of your face, hands, ankles, feet, or legs.  Your baby makes fewer than 10 movements in 2 hours.  You have severe headaches that do not go away when you take medicine.  You have vision changes. Summary  The third trimester is from week 28 through week 40, months 7 through 9. The third trimester is a time when the unborn baby (fetus) is growing rapidly.  During the third trimester, your discomfort may increase as you and your baby continue to gain weight. You may have abdominal, leg, and back pain, sleeping problems, and an increased need to urinate.  During the third trimester your breasts will keep growing and they will continue to become tender. A yellow fluid (colostrum) may leak from your breasts. This is the first milk you are producing for your baby.  False labor is a condition in which you feel small, irregular tightenings of the muscles in the womb (contractions) that eventually go away. These are called Braxton Hicks contractions. Contractions may last for hours, days, or even weeks before true labor sets in.  Signs of labor can include: abdominal cramps; regular contractions that start at 10 minutes apart and become stronger and more frequent with time; watery or bloody mucus discharge that comes from the vagina; increased pelvic pressure and dull back pain; and leaking of amniotic fluid. This information is not intended to replace advice  given to you by your health care provider. Make sure you discuss any questions you have with your health care provider. Document Released: 06/27/2001 Document Revised: 12/09/2015 Document Reviewed: 09/03/2012 Elsevier Interactive Patient Education  2017 [employer] Fetal Movement Counts Patient Name: ________________________________________________ Patient Due Date: ____________________ What is a fetal movement count? A fetal movement count is the number of times that you feel your baby move during a certain amount of time. This may also be called a fetal kick count. A fetal movement count is recommended for every pregnant woman. You may be asked to start counting fetal movements as early as week 28 of your pregnancy. Pay attention to when your baby is most active. You may notice your baby's sleep and wake cycles. You may also notice things that make your baby move more. You should do a fetal movement count:  When your baby is normally most active.  At the same time each day.  A good time to count movements is while you are resting, after having something to eat and drink. How do I count fetal movements? 1. Find a quiet, comfortable area. Sit, or lie down on your side. 2. Write down the date, the start time and stop time, and the number of movements that you felt between those two times. Take this information with you to your health care visits. 3. For 2 hours, count kicks, flutters, swishes, rolls, and jabs. You should feel at least 10 movements during 2 hours. 4. You may stop counting after you have felt 10 movements. 5. If you do not feel 10 movements in 2 hours, have something to eat and drink. Then, keep resting and counting for 1 hour. If you feel at least 4 movements during that hour, you may stop counting. Contact a health care provider if:  You feel fewer than 4 movements in 2 hours.  Your baby is not moving like he or she usually does. Date: ____________ Start time: ____________  Stop time: ____________ Movements: ____________ Date: ____________ Start time:   ____________ Stop time: ____________ Movements: ____________ Date: ____________ Start time: ____________ Stop time: ____________ Movements: ____________ Date: ____________ Start time: ____________ Stop time: ____________ Movements: ____________ Date: ____________ Start time: ____________ Stop time: ____________ Movements: ____________ Date: ____________ Start time: ____________ Stop time: ____________ Movements: ____________ Date: ____________ Start time: ____________ Stop time: ____________ Movements: ____________ Date: ____________ Start time: ____________ Stop time: ____________ Movements: ____________ Date: ____________ Start time: ____________ Stop time: ____________ Movements: ____________ This information is not intended to replace advice given to you by your health care provider. Make sure you discuss any questions you have with your health care provider. Document Released: 08/02/2006 Document Revised: 03/01/2016 Document Reviewed: 08/12/2015 Elsevier Interactive Patient Education  2018 [employer] Inc[employer]  

## 2017-02-06 ENCOUNTER — Telehealth: Payer: Self-pay | Admitting: General Practice

## 2017-02-06 NOTE — Telephone Encounter (Signed)
Called patient to schedule follow up OB appt. Patient stated that she now lives in [place].

## 2017-06-06 ENCOUNTER — Encounter (HOSPITAL_COMMUNITY): Payer: Self-pay

## 2019-01-10 ENCOUNTER — Emergency Department (HOSPITAL_COMMUNITY)
Admission: EM | Admit: 2019-01-10 | Discharge: 2019-01-10 | Disposition: A | Discharge status: Home / Self Care | Payer: BC Managed Care – PPO | Attending: Emergency Medicine | Admitting: Emergency Medicine

## 2019-01-10 ENCOUNTER — Emergency Department (HOSPITAL_COMMUNITY): Payer: BC Managed Care – PPO

## 2019-01-10 ENCOUNTER — Encounter (HOSPITAL_COMMUNITY): Payer: Self-pay | Admitting: Emergency Medicine

## 2019-01-10 DIAGNOSIS — M545 Low back pain, unspecified: Secondary | ICD-10-CM

## 2019-01-10 DIAGNOSIS — Z79899 Other long term (current) drug therapy: Secondary | ICD-10-CM | POA: Diagnosis not present

## 2019-01-10 DIAGNOSIS — Z87891 Personal history of nicotine dependence: Secondary | ICD-10-CM | POA: Diagnosis not present

## 2019-01-10 DIAGNOSIS — R202 Paresthesia of skin: Secondary | ICD-10-CM | POA: Insufficient documentation

## 2019-01-10 LAB — POC URINE PREG, ED: Preg Test, Ur: NEGATIVE

## 2019-01-10 MED ORDER — ACETAMINOPHEN 325 MG PO TABS
650.0000 mg | ORAL_TABLET | Freq: Once | ORAL | Status: AC
  Administered 2019-01-10: 650 mg via ORAL
  Filled 2019-01-10: qty 2

## 2019-01-10 MED ORDER — METHOCARBAMOL 500 MG PO TABS: 500.0000 mg | ORAL_TABLET | Freq: Every evening | ORAL | 0 refills | Status: AC | PRN
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
## 2019-01-10 NOTE — Discharge Instructions (Signed)

## 2019-01-10 NOTE — ED Notes (Signed)
Bed: WTR6 Expected date:  Expected time:  Means of arrival:  Comments: 
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
## 2019-02-24 ENCOUNTER — Other Ambulatory Visit: Payer: Self-pay

## 2019-02-24 DIAGNOSIS — N6320 Unspecified lump in the left breast, unspecified quadrant: Secondary | ICD-10-CM

## 2019-02-24 DIAGNOSIS — N6452 Nipple discharge: Secondary | ICD-10-CM

## 2019-02-27 ENCOUNTER — Ambulatory Visit
Admission: RE | Admit: 2019-02-27 | Discharge: 2019-02-27 | Disposition: A | Discharge status: Home / Self Care | Payer: BC Managed Care – PPO | Source: Ambulatory Visit

## 2019-02-27 ENCOUNTER — Other Ambulatory Visit: Payer: Self-pay

## 2019-02-27 DIAGNOSIS — N6320 Unspecified lump in the left breast, unspecified quadrant: Secondary | ICD-10-CM

## 2019-02-27 DIAGNOSIS — N6452 Nipple discharge: Secondary | ICD-10-CM

## 2019-12-21 IMAGING — US US BREAST*L* LIMITED INC AXILLA
1 series · 5 of 5 positions shown · non-contrast
Comparison: Previous exam(s).

CLINICAL DATA: Patient presents with focal tenderness and possible
lump in the outer left breast. The patient also describes
intermittent bilateral non spontaneous nipple discharge that is
clear/white since she had her child 2 years ago.

EXAM:
ULTRASOUND OF THE LEFT BREAST

[Series 1: us breast*left* limited inc axilla · 0.07mm/px · 5 of 5 slices shown]
[im 1/5]
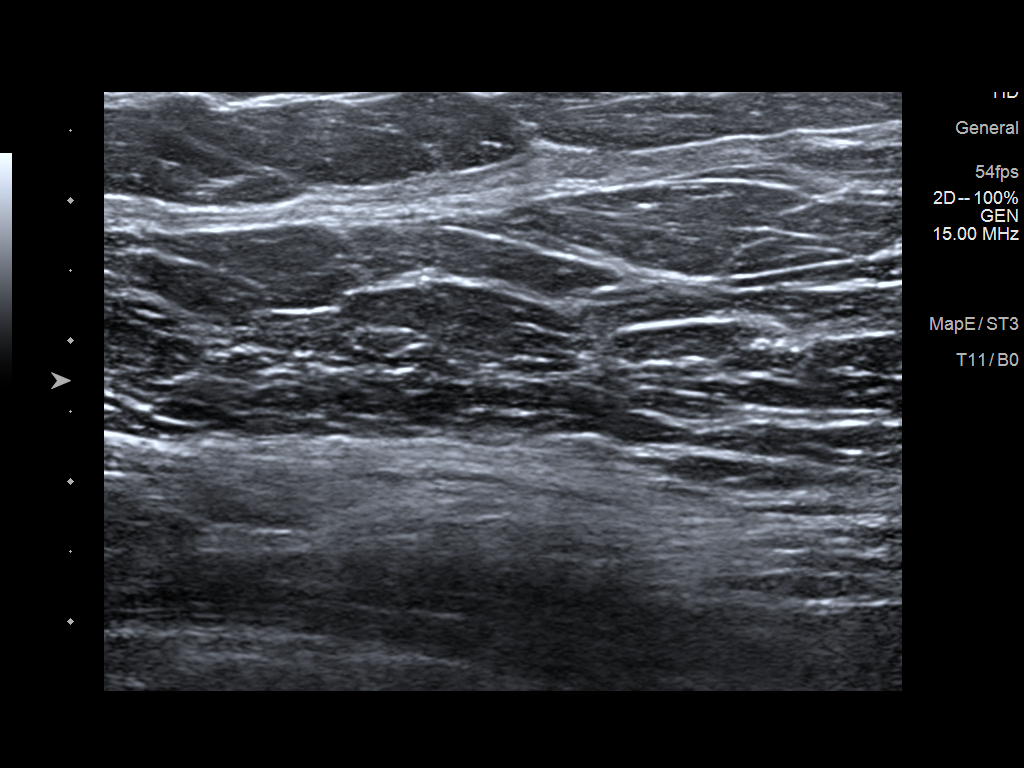
[im 2/5]
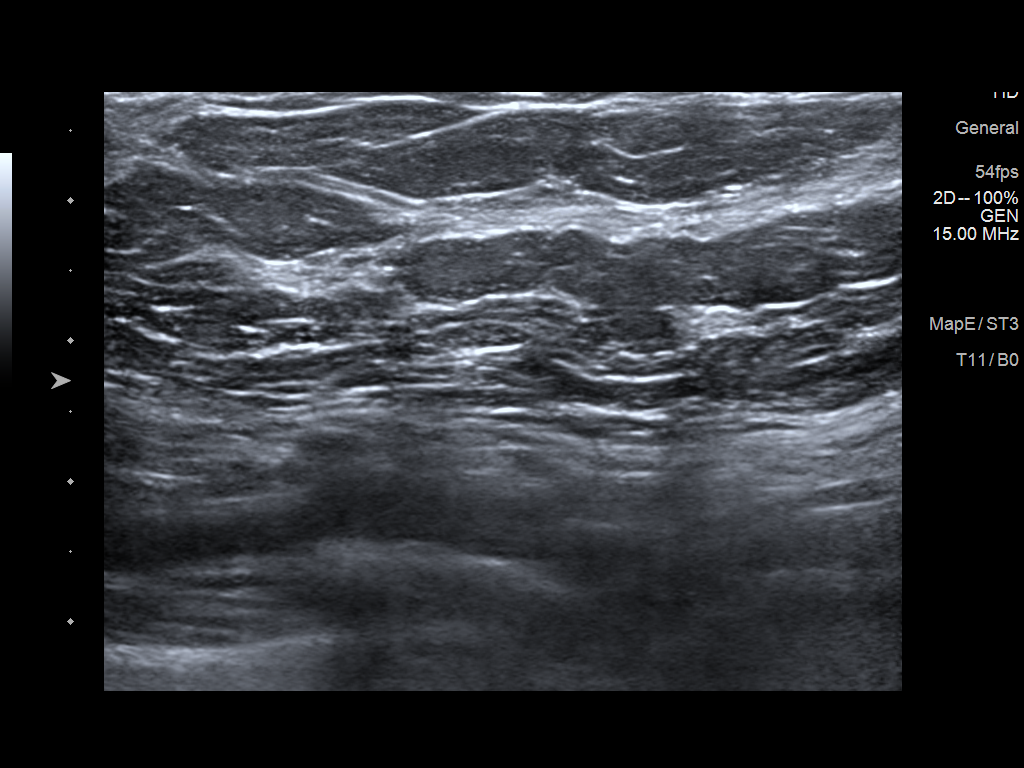
[im 3/5]
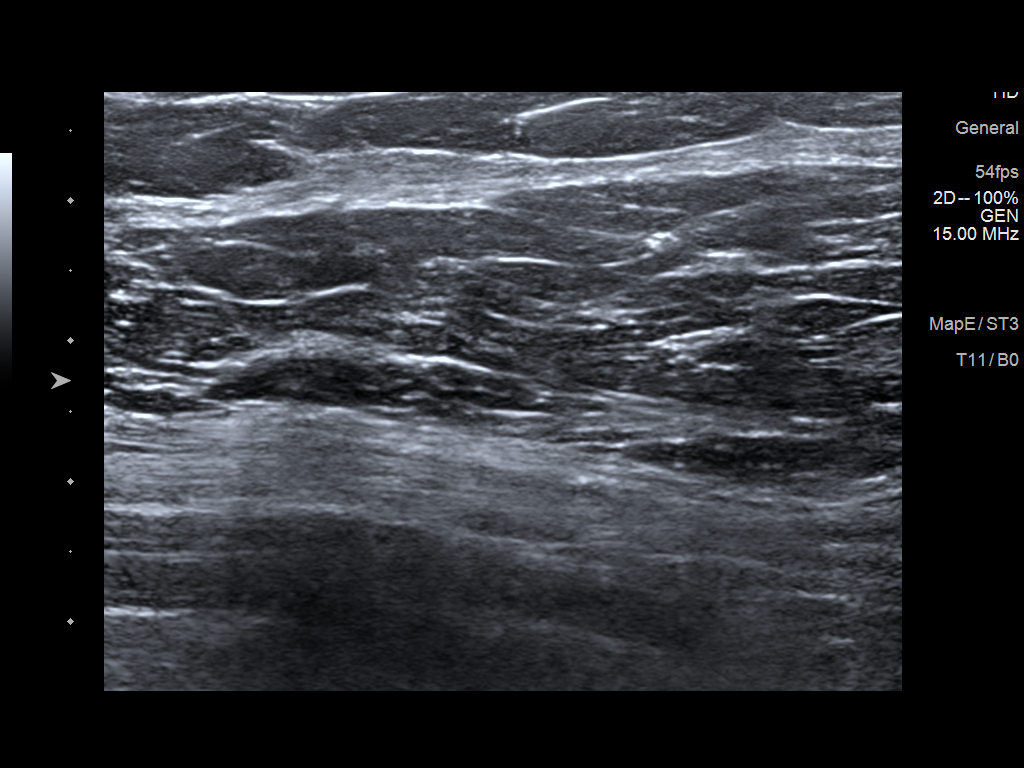
[im 4/5]
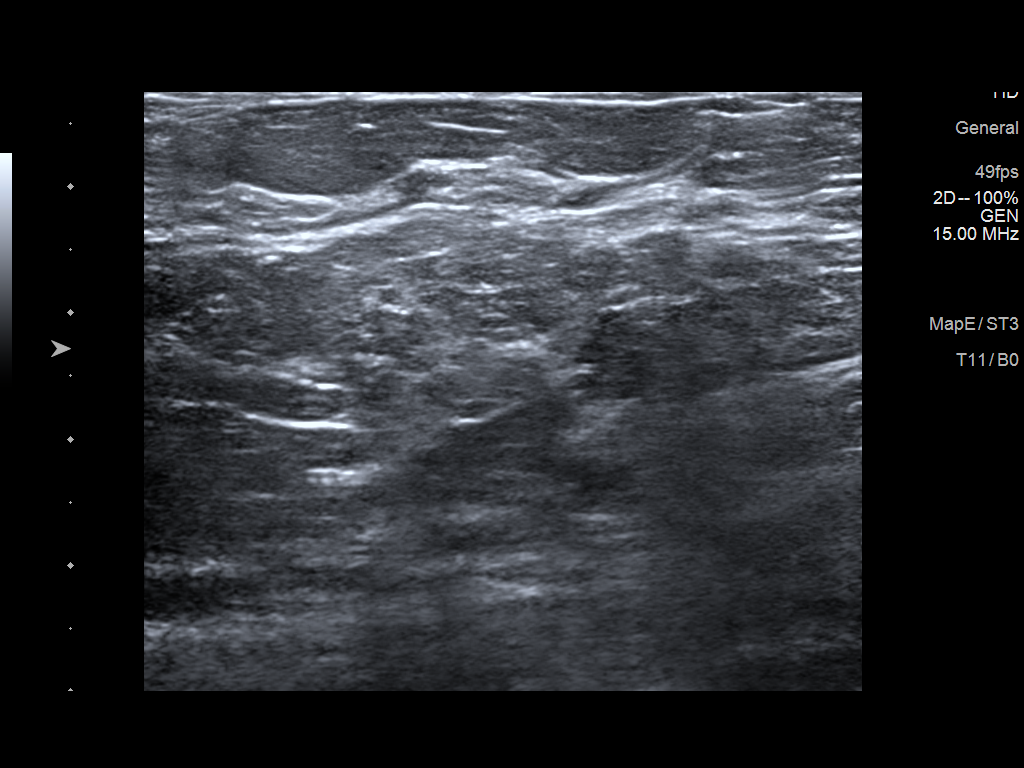
[im 5/5]
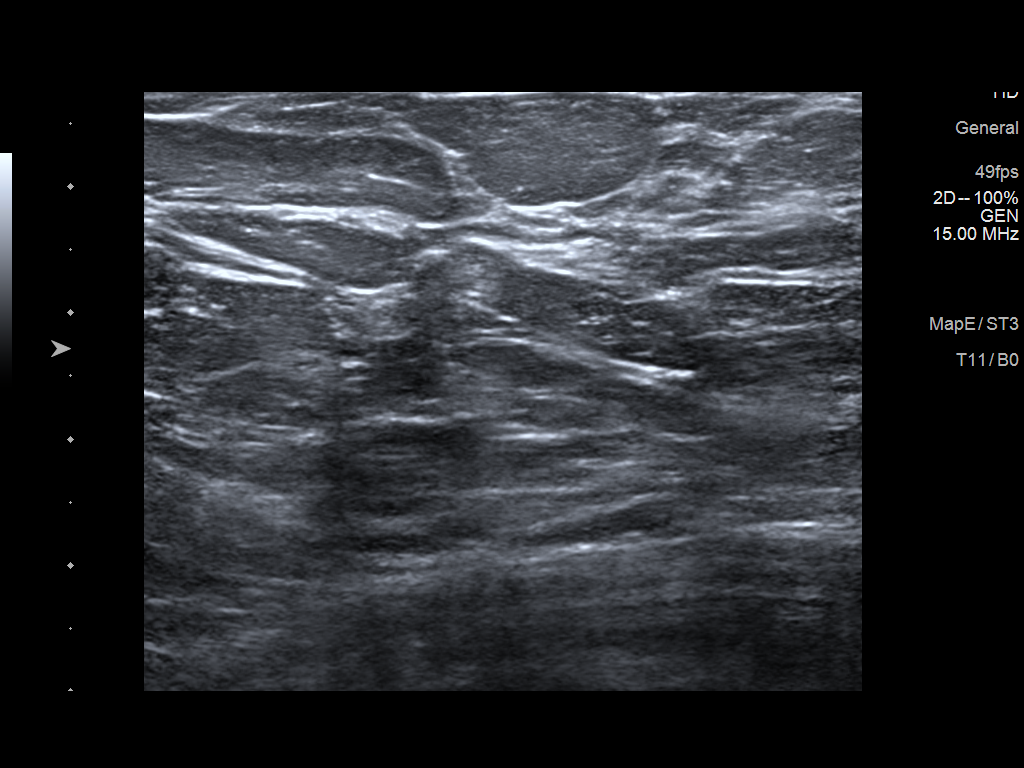

[5 of 5 positions shown; findings below may reference images not displayed]

FINDINGS: On physical exam, I palpated no discrete abnormality in the outer
left breast at the site of patient's concern.

Targeted ultrasound is performed, showing no suspicious cystic or
solid mass at 230-4 o'clock where the patient reports pain and
possible mass.
IMPRESSION: No sonographic evidence of malignancy in the outer left breast to
explain patient's pain and palpable finding.

RECOMMENDATION:
Recommend further evaluation for the patient's pain and palpable
concern be performed on a clinical basis. Features of suspicious
nipple discharge were discussed with the patient and should she
notice any of these, was asked to return for evaluation.

I have discussed the findings and recommendations with the patient.
Results were also provided in writing at the conclusion of the
visit. If applicable, a reminder letter will be sent to the patient
regarding the next appointment.

BI-RADS CATEGORY  1: Negative.
